# Patient Record
Sex: Male | Born: 1945 | Race: White | Hispanic: No | Marital: Married
Health system: Southern US, Community
[De-identification: ages and names within clinical notes are randomized; demographics above are authoritative.]

## PROBLEM LIST (undated history)

## (undated) DIAGNOSIS — F419 Anxiety disorder, unspecified: Secondary | ICD-10-CM

## (undated) DIAGNOSIS — M109 Gout, unspecified: Secondary | ICD-10-CM

## (undated) DIAGNOSIS — I10 Essential (primary) hypertension: Secondary | ICD-10-CM

## (undated) DIAGNOSIS — K259 Gastric ulcer, unspecified as acute or chronic, without hemorrhage or perforation: Secondary | ICD-10-CM

## (undated) DIAGNOSIS — D72820 Lymphocytosis (symptomatic): Secondary | ICD-10-CM

## (undated) HISTORY — PX: TOE AMPUTATION: SHX809

## (undated) HISTORY — PX: HIP FRACTURE SURGERY: SHX118

---

## 2015-01-18 ENCOUNTER — Ambulatory Visit: Payer: Medicare Other

## 2015-01-18 ENCOUNTER — Ambulatory Visit
Admission: EM | Admit: 2015-01-18 | Discharge: 2015-01-18 | Disposition: A | Discharge status: Home / Self Care | Payer: Medicare Other | Attending: Family Medicine | Admitting: Family Medicine

## 2015-01-18 DIAGNOSIS — F419 Anxiety disorder, unspecified: Secondary | ICD-10-CM | POA: Insufficient documentation

## 2015-01-18 DIAGNOSIS — R52 Pain, unspecified: Secondary | ICD-10-CM | POA: Diagnosis not present

## 2015-01-18 DIAGNOSIS — S8002XA Contusion of left knee, initial encounter: Secondary | ICD-10-CM | POA: Diagnosis not present

## 2015-01-18 DIAGNOSIS — W109XXA Fall (on) (from) unspecified stairs and steps, initial encounter: Secondary | ICD-10-CM | POA: Diagnosis not present

## 2015-01-18 DIAGNOSIS — M109 Gout, unspecified: Secondary | ICD-10-CM | POA: Insufficient documentation

## 2015-01-18 DIAGNOSIS — I10 Essential (primary) hypertension: Secondary | ICD-10-CM | POA: Insufficient documentation

## 2015-01-18 DIAGNOSIS — W19XXXA Unspecified fall, initial encounter: Secondary | ICD-10-CM

## 2015-01-18 DIAGNOSIS — S50812A Abrasion of left forearm, initial encounter: Secondary | ICD-10-CM | POA: Insufficient documentation

## 2015-01-18 DIAGNOSIS — S80212A Abrasion, left knee, initial encounter: Secondary | ICD-10-CM | POA: Insufficient documentation

## 2015-01-18 DIAGNOSIS — M79602 Pain in left arm: Secondary | ICD-10-CM | POA: Diagnosis present

## 2015-01-18 DIAGNOSIS — M25562 Pain in left knee: Secondary | ICD-10-CM | POA: Diagnosis present

## 2015-01-18 HISTORY — DX: Essential (primary) hypertension: I10

## 2015-01-18 HISTORY — DX: Lymphocytosis (symptomatic): D72.820

## 2015-01-18 HISTORY — DX: Gout, unspecified: M10.9

## 2015-01-18 HISTORY — DX: Gastric ulcer, unspecified as acute or chronic, without hemorrhage or perforation: K25.9

## 2015-01-18 HISTORY — DX: Anxiety disorder, unspecified: F41.9

## 2015-01-18 MED ORDER — DOXYCYCLINE HYCLATE 100 MG PO TABS: 100.0000 mg | ORAL_TABLET | Freq: Two times a day (BID) | ORAL | Status: AC

## 2015-01-18 MED ORDER — HYDROCODONE-ACETAMINOPHEN 5-325 MG PO TABS: 1.0000 | ORAL_TABLET | Freq: Four times a day (QID) | ORAL | Status: AC | PRN

## 2015-01-18 MED ORDER — TETANUS-DIPHTH-ACELL PERTUSSIS 5-2.5-18.5 LF-MCG/0.5 IM SUSP
0.5000 mL | Freq: Once | INTRAMUSCULAR | Status: AC
  Administered 2015-01-18: 0.5 mL via INTRAMUSCULAR

## 2015-01-18 NOTE — ED Provider Notes (Signed)
CSN: [id number]     Arrival date & time 01/18/15  1008 History   First MD Initiated Contact with Patient 01/18/15 1103     Chief Complaint  Patient presents with  . Fall    Pt missed bottom step of concrete steps last night and fell, landing on left knee, and then left arm. Avulsion to left elbow, abrasion and swelling to anterior left knee, and left low back pain although he denies hitting back (no discoloration to back noted). Also generalized body aching.   (Consider location/radiation/quality/duration/timing/severity/associated sxs/prior Treatment) HPI Comments: 69 yo male with a h/o right hip fracture about one year ago, on chronic prednisone, presents after a fall last night. States missed a step at a friends house and fell landing on his left side. Landed on his left knee. Complains of pain to the left low back/hip area. Scraped his left forearm. Denies hitting his head or loss of consciousness.   Patient is a 69 y.o. male presenting with fall. The history is provided by the patient.  Fall    Past Medical History  Diagnosis Date  . Gout   . Hypertension   . Gastric ulcer   . Anxiety   . Lymphocytosis     T-cell   Past Surgical History  Procedure Laterality Date  . Hip fracture surgery    . Toe amputation     No family history on file. History  Substance Use Topics  . Smoking status: Unknown If Ever Smoked  . Smokeless tobacco: Not on file  . Alcohol Use: Yes     Comment: rarely    Review of Systems  Allergies  Colchicine; Indomethacin; and Penicillins  Home Medications   Prior to Admission medications   Medication Sig Start Date End Date Taking? Authorizing Provider  escitalopram (LEXAPRO) 10 MG tablet Take 10 mg by mouth daily.   Yes Historical Provider, MD  folic acid (FOLVITE) 1 MG tablet Take 1 mg by mouth daily.   Yes Historical Provider, MD  lisinopril (PRINIVIL,ZESTRIL) 10 MG tablet Take 10 mg by mouth daily.   Yes Historical Provider, MD  methotrexate  (RHEUMATREX) 2.5 MG tablet Take 2.5 mg by mouth once a week. Caution:Chemotherapy. Protect from light.   Yes Historical Provider, MD  predniSONE (DELTASONE) 20 MG tablet Take 20 mg by mouth daily with breakfast.   Yes Historical Provider, MD  vitamin B-12 (CYANOCOBALAMIN) 1000 MCG tablet Take 2,000 mcg by mouth daily.   Yes Historical Provider, MD  doxycycline (VIBRA-TABS) 100 MG tablet Take 1 tablet (100 mg total) by mouth 2 (two) times daily. 01/18/15   Payton Mccallum, MD  HYDROcodone-acetaminophen (NORCO/VICODIN) 5-325 MG per tablet Take 1-2 tablets by mouth every 6 (six) hours as needed. 01/18/15   Payton Mccallum, MD   BP 154/99 mmHg  Pulse 98  Temp(Src) 98 F (36.7 C) (Oral)  Ht 5\' 11"  (1.803 m)  Wt 208 lb (94.348 kg)  BMI 29.02 kg/m2  SpO2 100% Physical Exam  Constitutional: He appears well-developed and well-nourished. No distress.  Musculoskeletal: He exhibits tenderness.  Left upper extremity neurovascularly intact; normal ROM; no bony tenderness over forearm or elbow; no shoulder or clavicle tenderness;  Anterior left knee edema and tenderness to palpation; limited flexion/extension due to pain; left lower extremity neurovascularly intact  Neurological: He is alert.  Skin: He is not diaphoretic.  Large skin abrasion on left forearm; and moderate skin abrasion on left knee.  Nursing note and vitals reviewed.   ED Course  Procedures (  including critical care time) Labs Review Labs Reviewed - No data to display  Imaging Review Dg Knee Complete 4 Views Left  01/18/2015   CLINICAL DATA:  Fall, acute pain and swelling of the left knee.  EXAM: LEFT KNEE - COMPLETE 4+ VIEW  COMPARISON:  None.  FINDINGS: Diffuse osteopenia of the left lower extremity. 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 Peripheral atherosclerosis noted. Femoral vascular stents partially imaged on the exam.  Normal alignment without acute osseous  finding or fracture. Anterior patellar region soft tissue swelling noted.  IMPRESSION: Anterior patella soft tissue swelling without acute osseous finding or fracture.  No joint effusion  Osteopenia and peripheral atherosclerosis   Electronically Signed   By: Judie Petit.  Shick M.D.   On: 01/18/2015 12:40   Dg Hip Unilat With Pelvis 2-3 Views Left  01/18/2015   CLINICAL DATA:  Pain.  EXAM: LEFT HIP (WITH PELVIS) 2-3 VIEWS  COMPARISON:  None.  FINDINGS: Patient has had previous ORIF of the right hip. Left femoral artery stent graft. No evidence for acute fracture or dislocation.  IMPRESSION: No evidence for acute  abnormality.   Electronically Signed   By: Norva Pavlov M.D.   On: 01/18/2015 12:41     MDM   1. Fall, initial encounter   2. Pain   3. Forearm abrasion, left, initial encounter   4. Knee contusion, left, initial encounter    Discharge Medication List as of 01/18/2015  1:25 PM    START taking these medications   Details  doxycycline (VIBRA-TABS) 100 MG tablet Take 1 tablet (100 mg total) by mouth 2 (two) times daily., Starting 01/18/2015, Until Discontinued, Normal    HYDROcodone-acetaminophen (NORCO/VICODIN) 5-325 MG per tablet Take 1-2 tablets by mouth every 6 (six) hours as needed., Starting 01/18/2015, Until Discontinued, Print         Plan: 1. x-ray results(negative for acute fracture) and diagnosis reviewed with patient 2. rx as per orders; risks, benefits, potential side effects reviewed with patient; prophylactic antibiotic due to underlying chronic conditions and medications; abrasion wounds dressed; wound care instructions given for abrasions 3. Recommend supportive treatment with rest ice, elevation of left lower extremity 4. F/u with PCP next week for recheck abrasions/wounds or here prn  Payton Mccallum, MD 01/18/15 1326

## 2015-01-29 ENCOUNTER — Ambulatory Visit
Admission: EM | Admit: 2015-01-29 | Discharge: 2015-01-29 | Disposition: A | Discharge status: Home / Self Care | Payer: Medicare Other | Attending: Family Medicine | Admitting: Family Medicine

## 2015-01-29 ENCOUNTER — Ambulatory Visit
Admission: RE | Admit: 2015-01-29 | Discharge: 2015-01-29 | Disposition: A | Discharge status: Home / Self Care | Payer: Medicare Other | Source: Ambulatory Visit | Attending: Emergency Medicine | Admitting: Emergency Medicine

## 2015-01-29 ENCOUNTER — Encounter: Payer: Self-pay | Admitting: Emergency Medicine

## 2015-01-29 ENCOUNTER — Ambulatory Visit
Admission: RE | Admit: 2015-01-29 | Discharge: 2015-01-29 | Disposition: A | Discharge status: Home / Self Care | Payer: Medicare Other | Source: Ambulatory Visit | Attending: *Deleted | Admitting: *Deleted

## 2015-01-29 DIAGNOSIS — S8012XD Contusion of left lower leg, subsequent encounter: Secondary | ICD-10-CM

## 2015-01-29 DIAGNOSIS — R609 Edema, unspecified: Secondary | ICD-10-CM

## 2015-01-29 DIAGNOSIS — S8002XD Contusion of left knee, subsequent encounter: Secondary | ICD-10-CM

## 2015-01-29 DIAGNOSIS — M7989 Other specified soft tissue disorders: Secondary | ICD-10-CM | POA: Insufficient documentation

## 2015-01-29 NOTE — ED Provider Notes (Addendum)
CSN: [id number]     Arrival date & time 01/29/15  1346 History   First MD Initiated Contact with Patient 01/29/15 1504     Chief Complaint  Patient presents with  . Knee Pain    left knee  . Leg Swelling   (Consider location/radiation/quality/duration/timing/severity/associated sxs/prior Treatment) HPI  this is a 69 year old male who fell 2 weeks ago was examined here by Dr. Izell Carolinaonti where x-rays showed no fracture of his left knee and a large abrasion of the lateral elbow. He returns today because of ongoing left knee pain where he indicates the fibular head. He notices it for a short period time when he first stands and begins to walk.  He does admit that it has been improving every single day. He has not been walking much since March of this year according to his wife. He also had minor back spasms this morning since resolved. Besides the fibular head pain it is noticed that he has a shiny swollen left lower extremity with pitting edema worse than the right.  Past Medical History  Diagnosis Date  . Gout   . Hypertension   . Gastric ulcer   . Anxiety   . Lymphocytosis     T-cell   Past Surgical History  Procedure Laterality Date  . Hip fracture surgery    . Toe amputation     History reviewed. No pertinent family history. History  Substance Use Topics  . Smoking status: Unknown If Ever Smoked  . Smokeless tobacco: Not on file  . Alcohol Use: Yes     Comment: rarely    Review of Systems  Constitutional: Positive for activity change.  Musculoskeletal: Positive for gait problem.  All other systems reviewed and are negative.   Allergies  Colchicine; Indomethacin; and Penicillins  Home Medications   Prior to Admission medications   Medication Sig Start Date End Date Taking? Authorizing Provider  ALPRAZolam (XANAX) 0.25 MG tablet Take 0.25 mg by mouth 3 (three) times daily as needed for anxiety.   Yes Historical Provider, MD  aspirin 81 MG tablet Take 81 mg by mouth daily.    Yes Historical Provider, MD  Febuxostat (ULORIC) 80 MG TABS Take 1 tablet by mouth daily.   Yes Historical Provider, MD  furosemide (LASIX) 40 MG tablet Take 40 mg by mouth daily.   Yes Historical Provider, MD  traMADol (ULTRAM) 50 MG tablet Take 50 mg by mouth every 6 (six) hours as needed.   Yes Historical Provider, MD  doxycycline (VIBRA-TABS) 100 MG tablet Take 1 tablet (100 mg total) by mouth 2 (two) times daily. 01/18/15   Payton Mccallumrlando Conty, MD  escitalopram (LEXAPRO) 10 MG tablet Take 10 mg by mouth daily.    Historical Provider, MD  folic acid (FOLVITE) 1 MG tablet Take 1 mg by mouth daily.    Historical Provider, MD  HYDROcodone-acetaminophen (NORCO/VICODIN) 5-325 MG per tablet Take 1-2 tablets by mouth every 6 (six) hours as needed. 01/18/15   Payton Mccallumrlando Conty, MD  lisinopril (PRINIVIL,ZESTRIL) 10 MG tablet Take 10 mg by mouth daily.    Historical Provider, MD  methotrexate (RHEUMATREX) 2.5 MG tablet Take 2.5 mg by mouth once a week. Caution:Chemotherapy. Protect from light.    Historical Provider, MD  predniSONE (DELTASONE) 20 MG tablet Take 20 mg by mouth daily with breakfast.    Historical Provider, MD  vitamin B-12 (CYANOCOBALAMIN) 1000 MCG tablet Take 2,000 mcg by mouth daily.    Historical Provider, MD   BP 113/54 mmHg  Pulse 92  Temp(Src) 98.2 F (36.8 C) (Tympanic)  Resp 16  Ht 6' (1.829 m)  Wt 208 lb (94.348 kg)  BMI 28.20 kg/m2  SpO2 99% Physical Exam  Constitutional: He appears well-developed and well-nourished.  HENT:  Head: Normocephalic and atraumatic.  Eyes: Pupils are equal, round, and reactive to light.  Musculoskeletal:  Admission of the left lateral elbow shows a large abrasion which is healing. Distally there appears to be a small amount of blood at the edge of the wound. He has numerous chemotic spots in both arms. Examination of the left knee shows tenderness over the fibular head no induration and no crepitus is appreciated. However the leg has a very shiny  appearance from swelling with 1-2+ pitting edema and a larger diameter when compared to the right. Maximal circumference on the left was 375 mm maximum circumference on the right was 350 mm There is no significant tenderness posteriorly and no warmth. However because of his inactivity recently and with the swelling and pitting edema concerned for possible DVT and will rule it out  Nursing note and vitals reviewed.   ED Course  Procedures (including critical care time) Labs Review Labs Reviewed - No data to display  Imaging Review US Venous Img Lower Unilateral Left  01/29/2015   CLINICAL DATA:  Subacute onset of left lower extremity pain and swelling for 2 weeks. Initial encounter.  EXAM: LEFT LOWER EXTREMITY VENOUS DOPPLER ULTRASOUND  TECHNIQUE: Gray-scale sonography with graded compression, as well as color Doppler and duplex ultrasound were performed to evaluate the lower extremity deep venous systems from the level of the common femoral vein and including the common femoral, femoral, profunda femoral, popliteal and calf veins including the posterior tibial, peroneal and gastrocnemius veins when visible. The superficial great saphenous vein was also interrogated. Spectral Doppler was utilized to evaluate flow at rest and with distal augmentation maneuvers in the common femoral, femoral and popliteal veins.  COMPARISON:  None.  FINDINGS: Contralateral Common Femoral Vein: Respiratory phasicity is normal and symmetric with the symptomatic side. No evidence of thrombus. Normal compressibility.  Common Femoral Vein: No evidence of thrombus. Normal compressibility, respiratory phasicity and response to augmentation.  Saphenofemoral Junction: No evidence of thrombus. Normal compressibility and flow on color Doppler imaging.  Profunda Femoral Vein: No evidence of thrombus. Normal compressibility and flow on color Doppler imaging.  Femoral Vein: No evidence of thrombus. Normal compressibility, respiratory  phasicity and response to augmentation.  Popliteal Vein: No evidence of thrombus. Normal compressibility, respiratory phasicity and response to augmentation.  Calf Veins: No evidence of thrombus. Normal compressibility and flow on color Doppler imaging. The peroneal vein is not visualized on this study.  Superficial Great Saphenous Vein: No evidence of thrombus. Normal compressibility and flow on color Doppler imaging.  Venous Reflux:  None.  Other Findings:  None.  IMPRESSION: No evidence of deep venous thrombosis.   Electronically Signed   By: Roanna Raider M.D.   On: 01/29/2015 17:31     MDM   1. Contusion of knee and lower leg, left, subsequent encounter   2. Pitting edema    Discharge Medication List as of 01/29/2015  3:41 PM     Plan: 1. Diagnosis reviewed with patient 2. rx as per orders; risks, benefits, potential side effects reviewed with patient 3. Recommend supportive treatment with rest,elevation 4. F/u prn if symptoms worsen or don't improve I talked with the patient and his wife. His fibular head pain is actually improving day  by day and should expect to continue to improve over time. His back spasm is chronic and there is nothing that needs to be done at this time. However I am concerned with the swelling and pitting edema of his left lower extremity especially with his  inactivity. He is agreeable to going to [facility] for a venous Doppler ultrasound which will be performed at this afternoon.  Lutricia Feil, PA-C 01/29/15 1544 received a call from radiology stating his venous Doppler ultrasound of the left lower extremity was negative with no evidence of a deep venous thrombosis. I spoke to the patient and told him it's imperative that he elevate his legs sufficiently to control the swelling and to keep his appointment with his primary care next Wednesday.  Lutricia Feil, PA-C 01/29/15 [place]

## 2015-01-29 NOTE — ED Notes (Signed)
Patient here for follow-up visit from fall 2 weeks ago.  Patient c/o ongoing pain in his left knee and swelling in his left lower leg.  Patient reports ongoing spasm on his left lower back.

## 2016-05-02 IMAGING — CR DG HIP (WITH OR WITHOUT PELVIS) 2-3V*L*
3 series · 3 of 3 positions shown · non-contrast
Comparison: None.

CLINICAL DATA: Pain.

EXAM:
LEFT HIP (WITH PELVIS) 2-3 VIEWS

[pelvis ap]
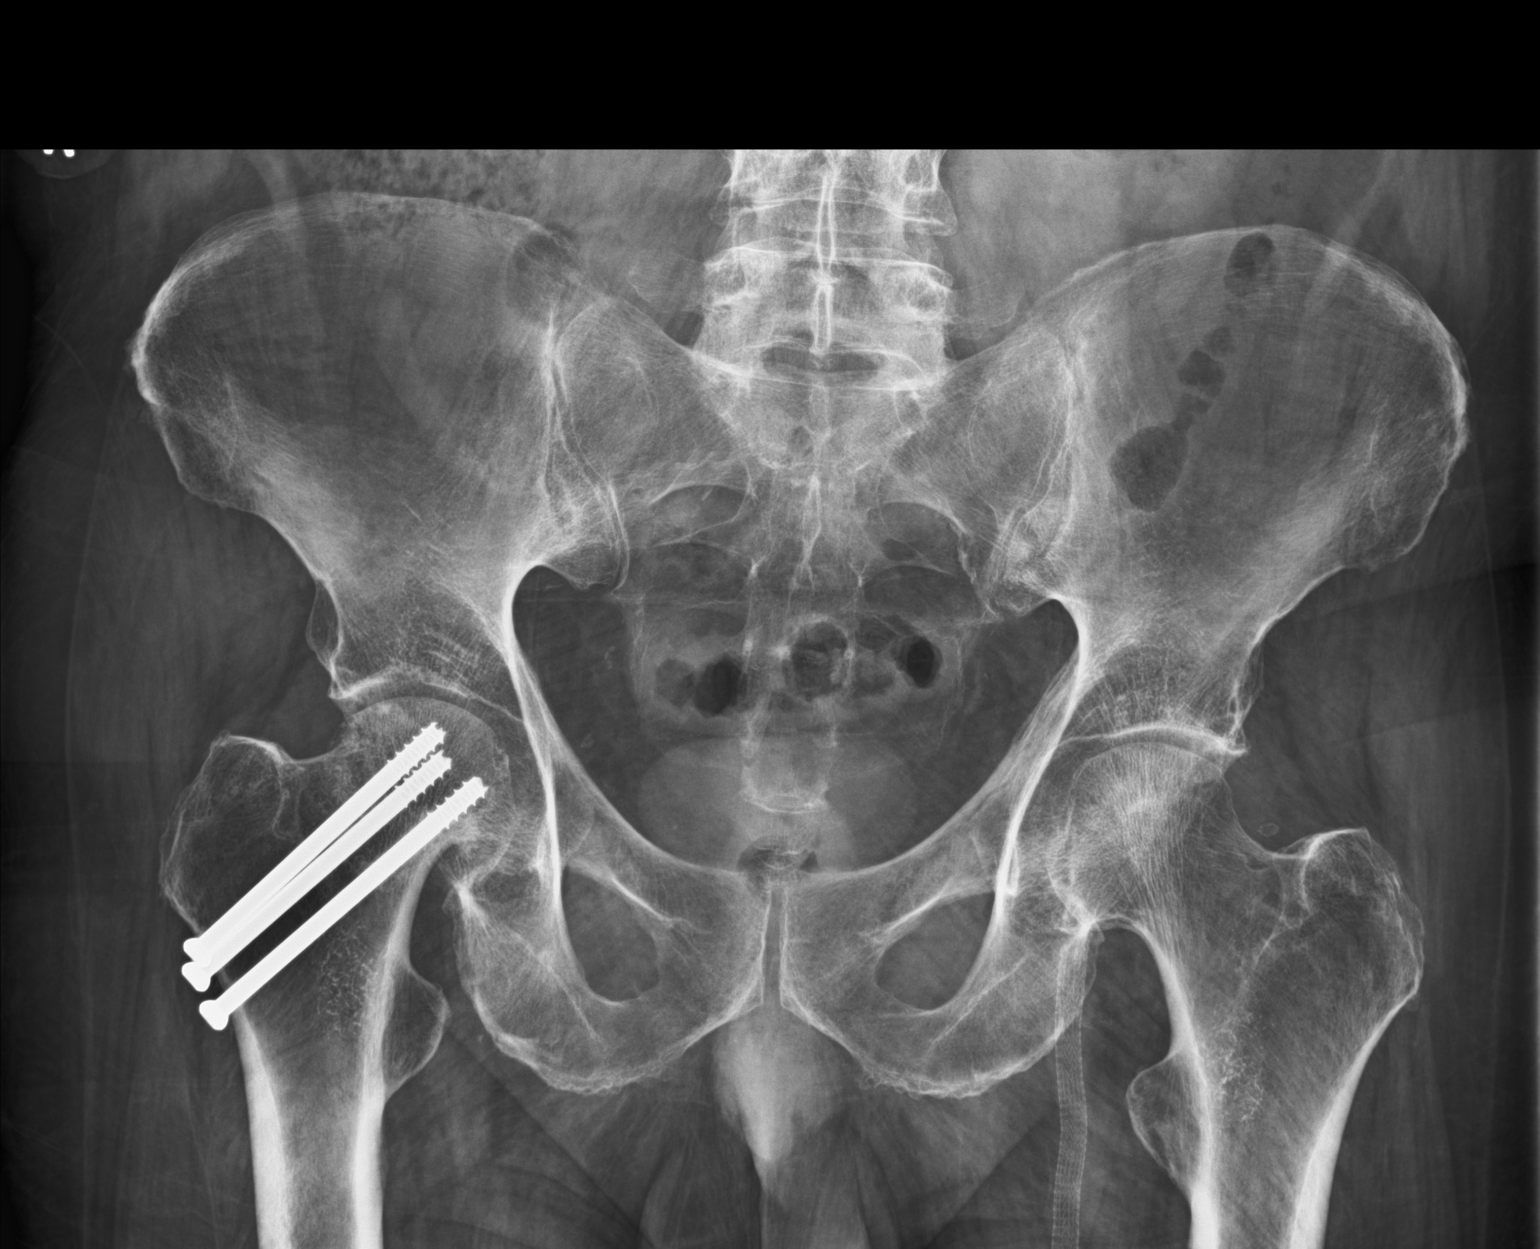

[hip ap]
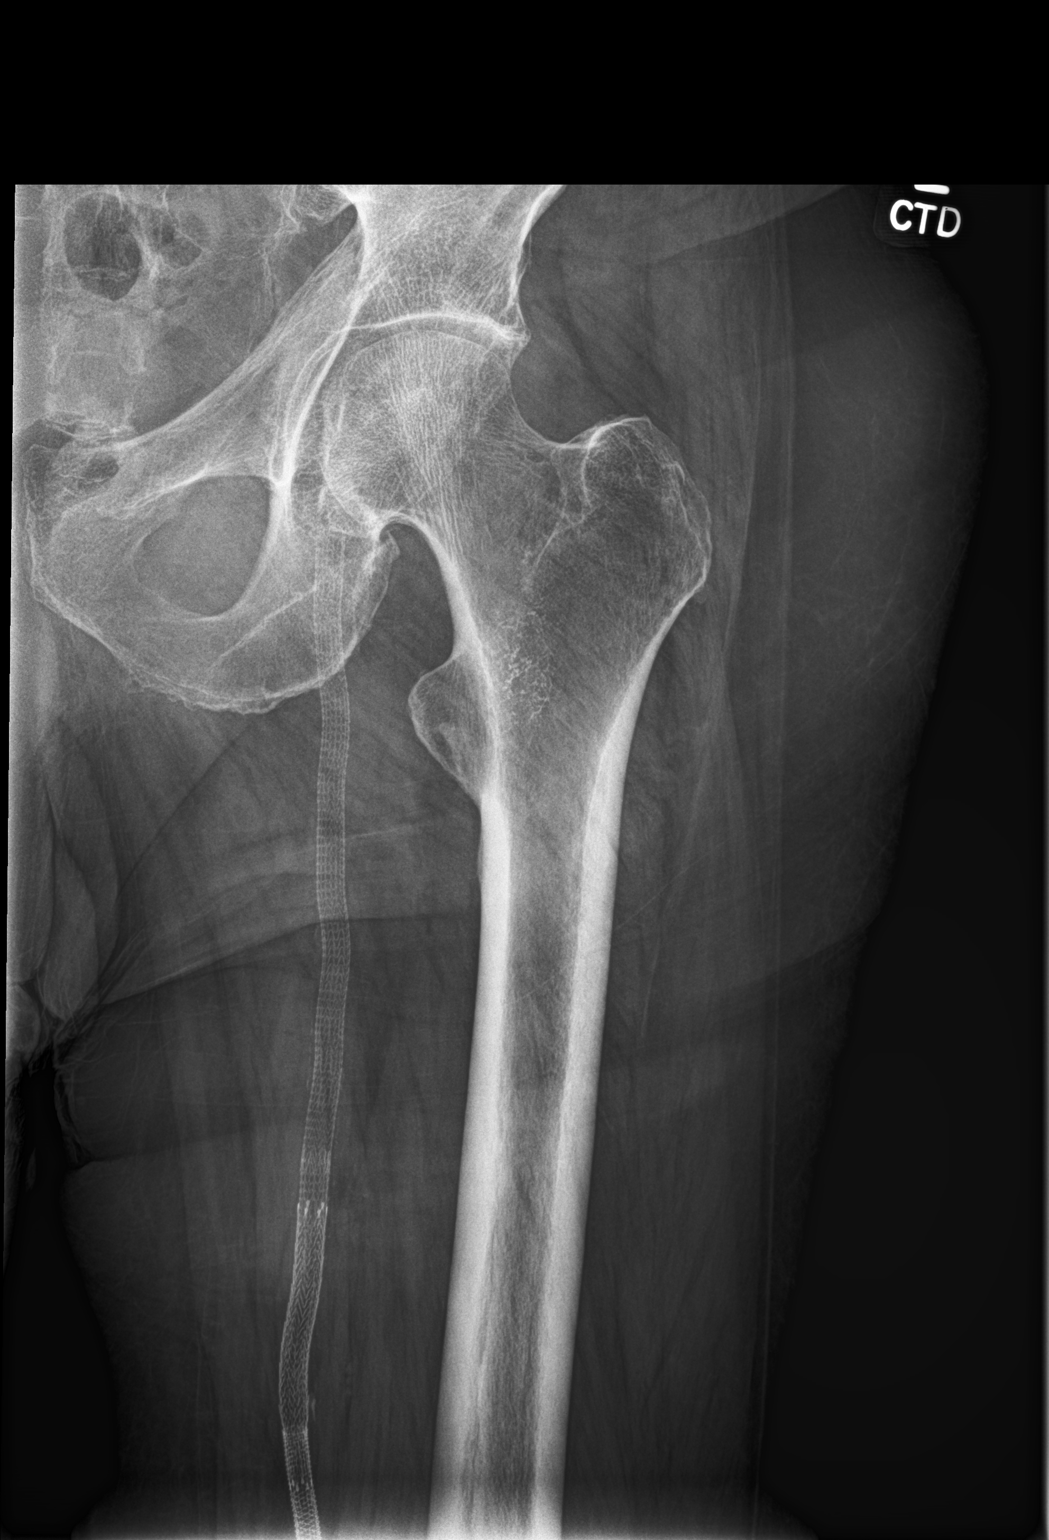

[hip lat]
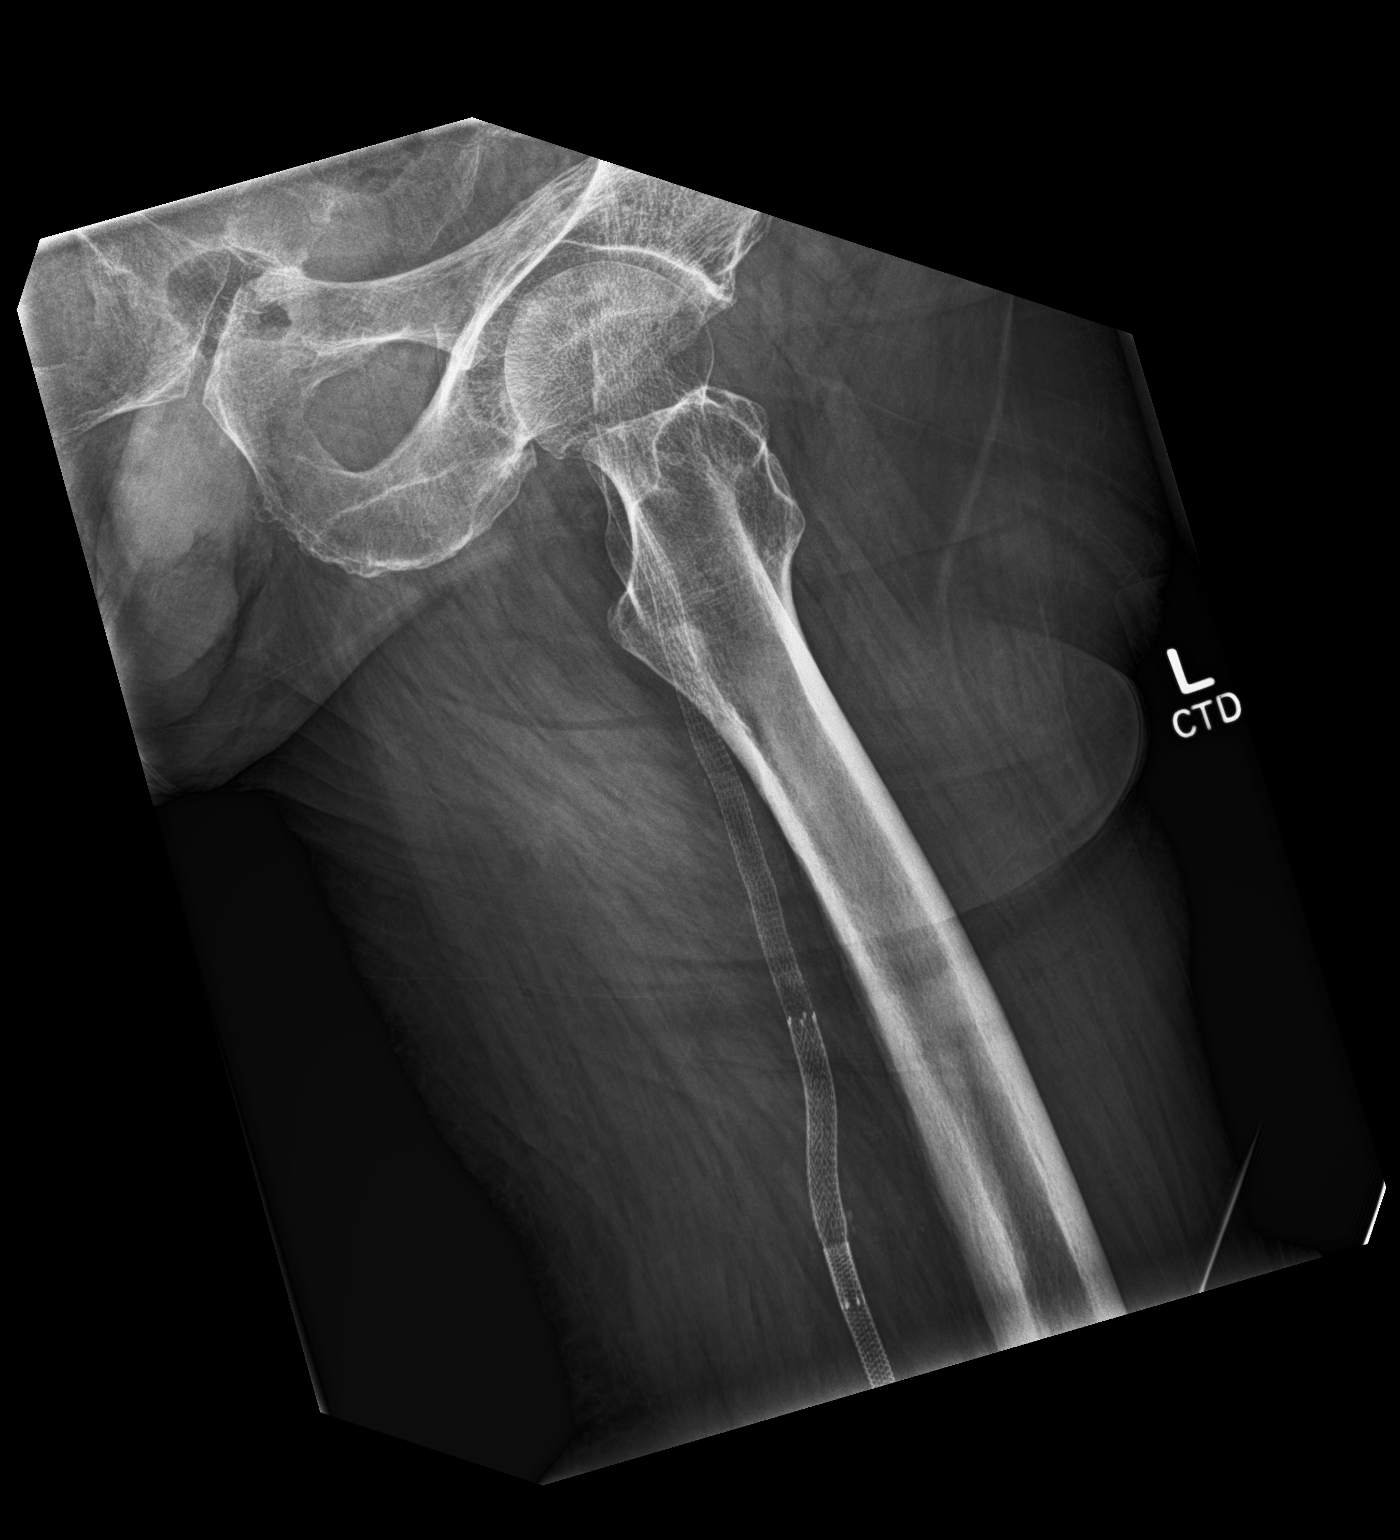

[3 of 3 positions shown; findings below may reference images not displayed]

FINDINGS: Patient has had previous ORIF of the right hip. Left femoral artery
stent graft. No evidence for acute fracture or dislocation.
IMPRESSION: No evidence for acute  abnormality.

## 2016-05-02 IMAGING — CR DG KNEE COMPLETE 4+V*L*
5 series · 5 of 5 positions shown · non-contrast
Comparison: None.

CLINICAL DATA: Fall, acute pain and swelling of the left knee.

EXAM:
LEFT KNEE - COMPLETE 4+ VIEW

[knee ap (1 of 3)]
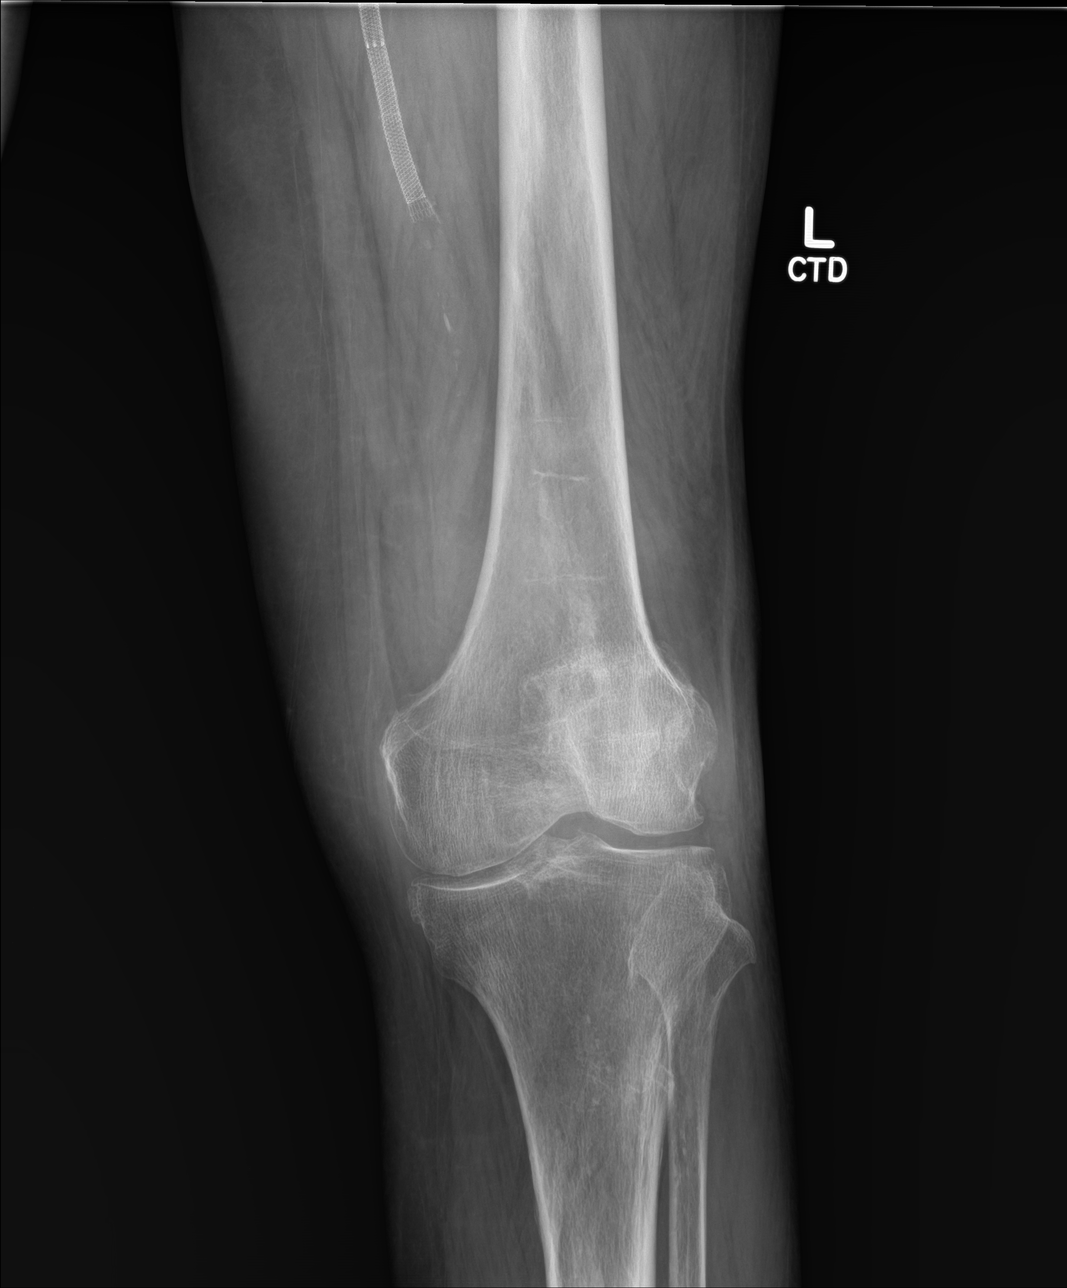

[knee lat]
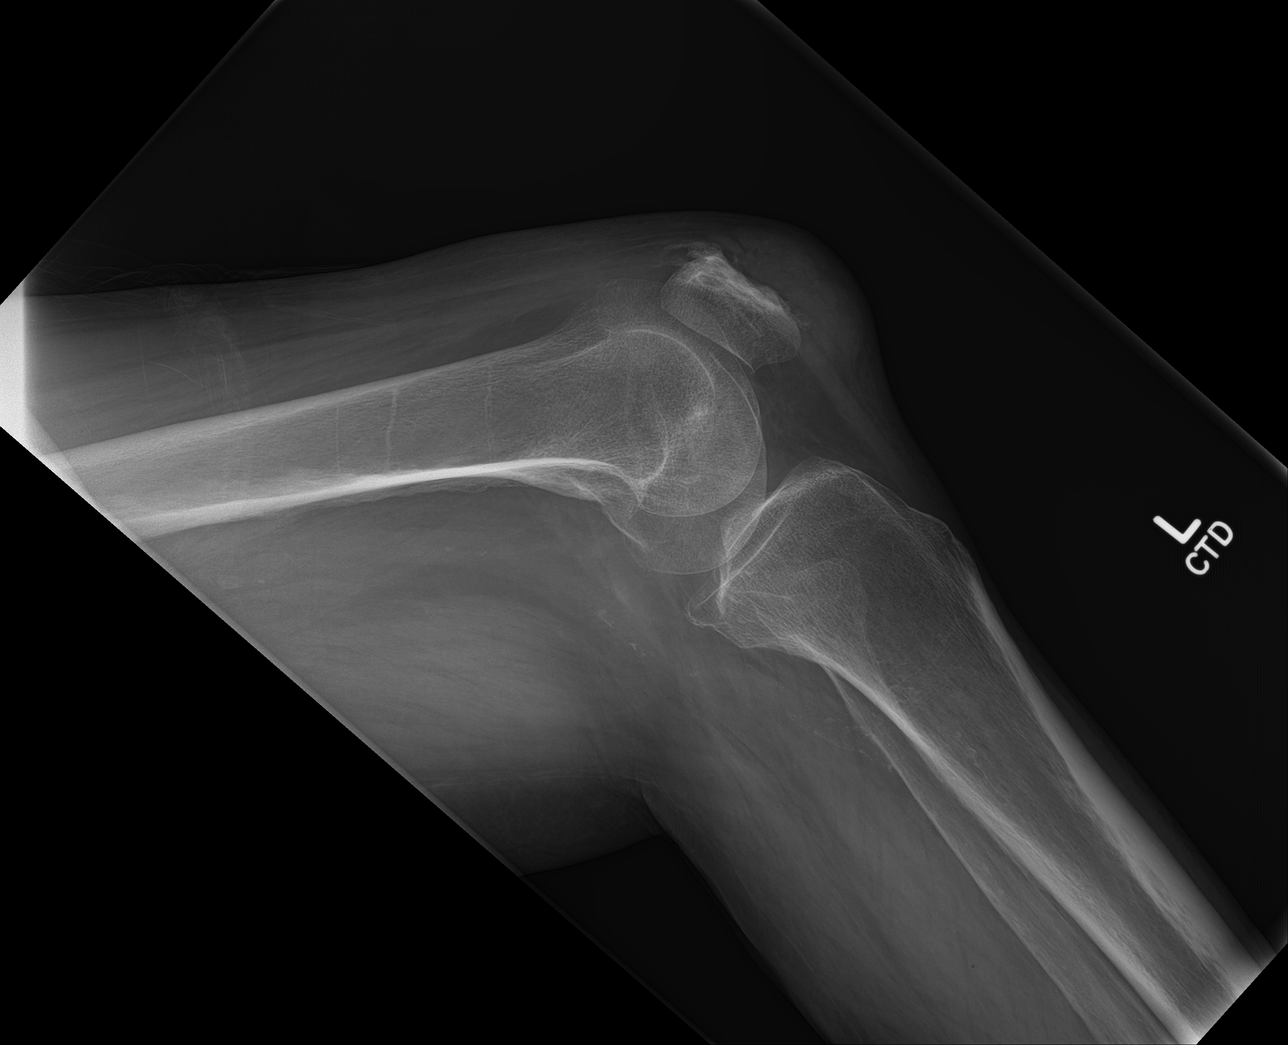

[patella skyline]
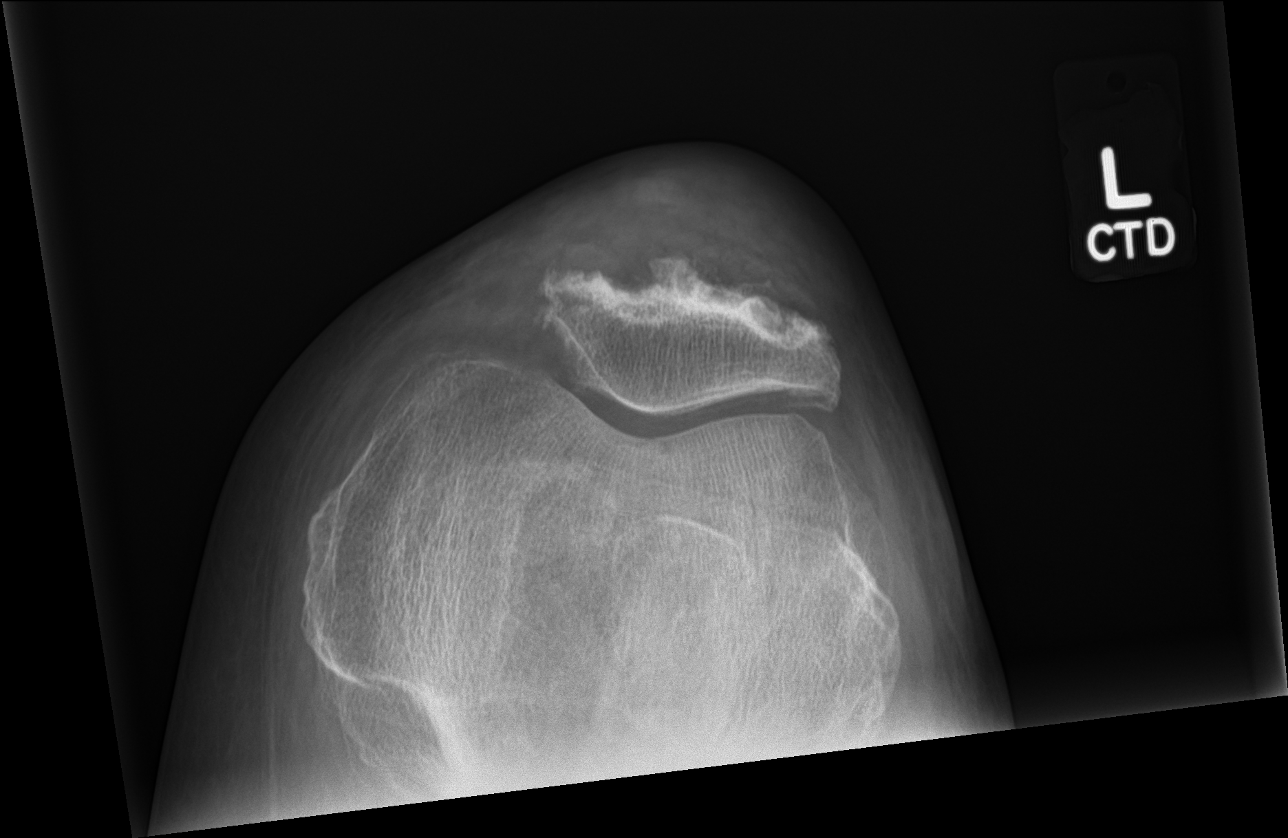

[knee ap (2 of 3)]
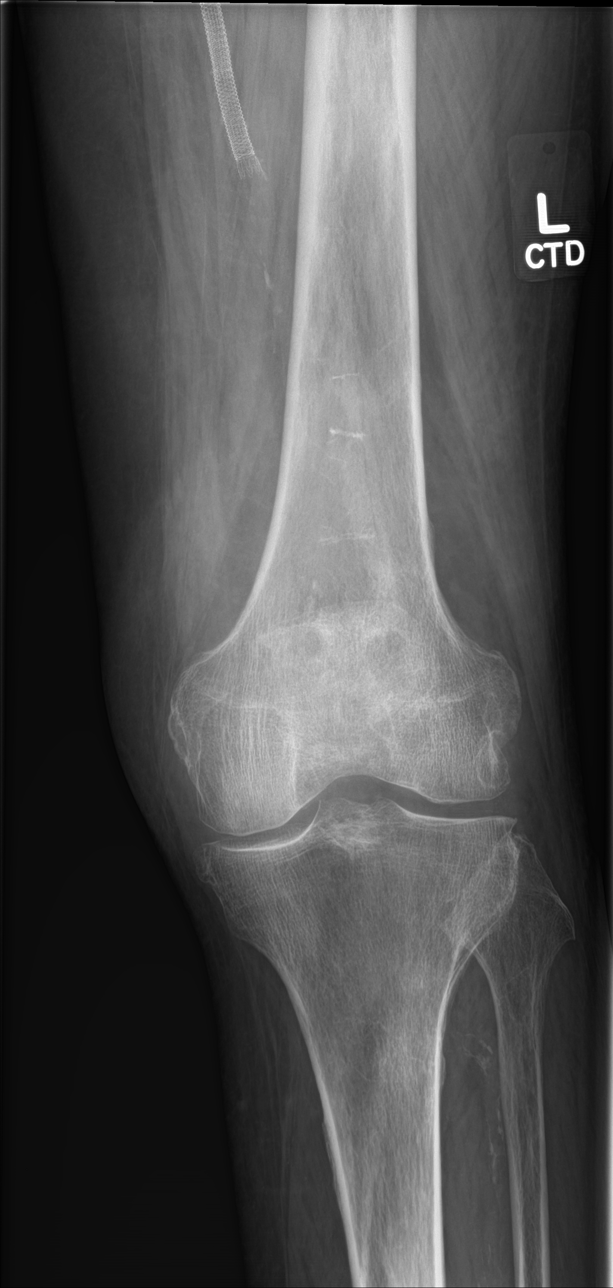

[knee ap (3 of 3)]
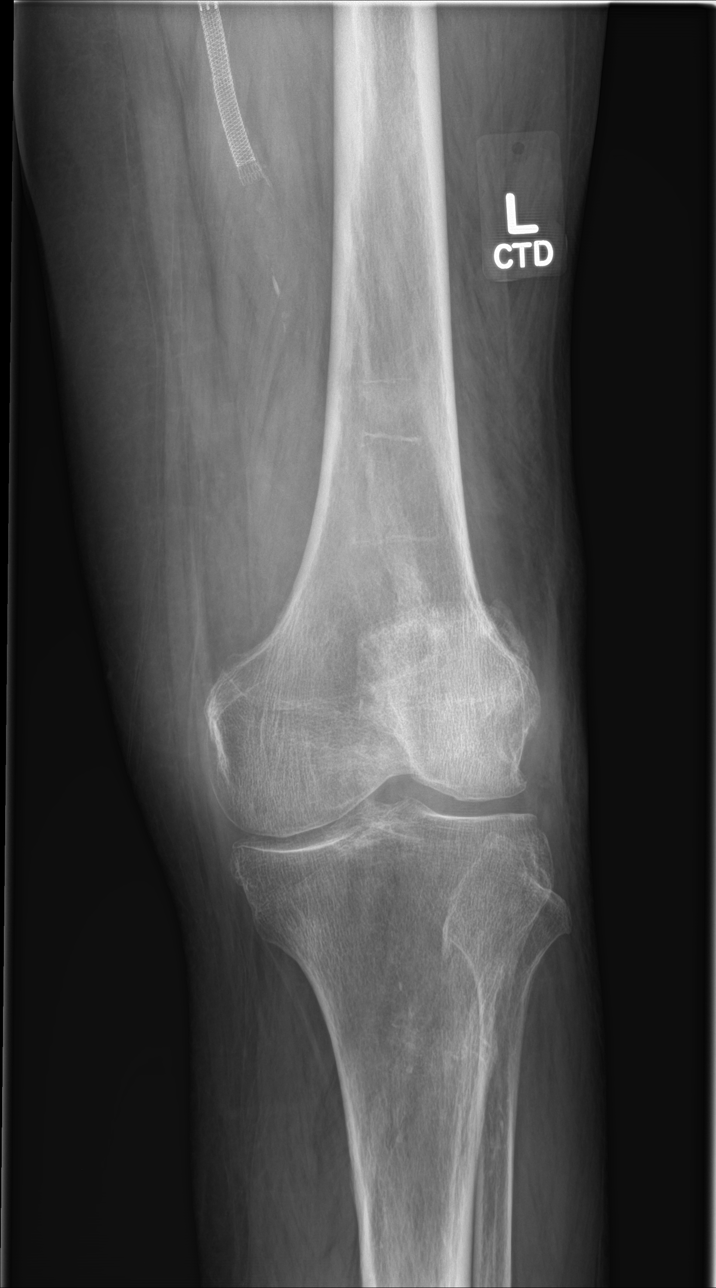

[5 of 5 positions shown; findings below may reference images not displayed]

FINDINGS: Diffuse osteopenia of the left lower extremity. Mild
tricompartmental osteoarthritis of the left knee with joint space
loss, sclerosis and bony spurring. Peripheral atherosclerosis noted.
Femoral vascular stents partially imaged on the exam.

Normal alignment without acute osseous finding or fracture. Anterior
patellar region soft tissue swelling noted.
IMPRESSION: Anterior patella soft tissue swelling without acute osseous finding
or fracture.

No joint effusion

Osteopenia and peripheral atherosclerosis

## 2017-05-28 IMAGING — US US EXTREM LOW VENOUS*L*
1 series · 13 of 24 positions shown · non-contrast
Comparison: None.

CLINICAL DATA: Subacute onset of left lower extremity pain and
swelling for 2 weeks. Initial encounter.



[Series 1: us extrem low venous*left* · 0.11mm/px · 13 of 26 slices shown]
[im 1/26]
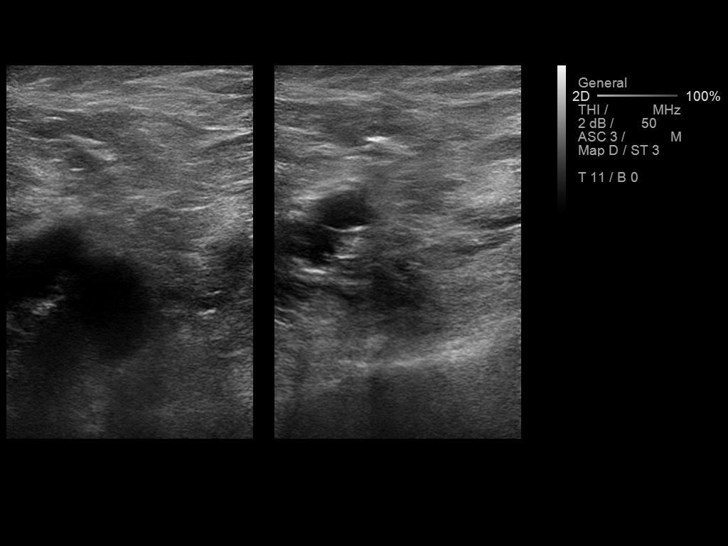
[im 3/26]
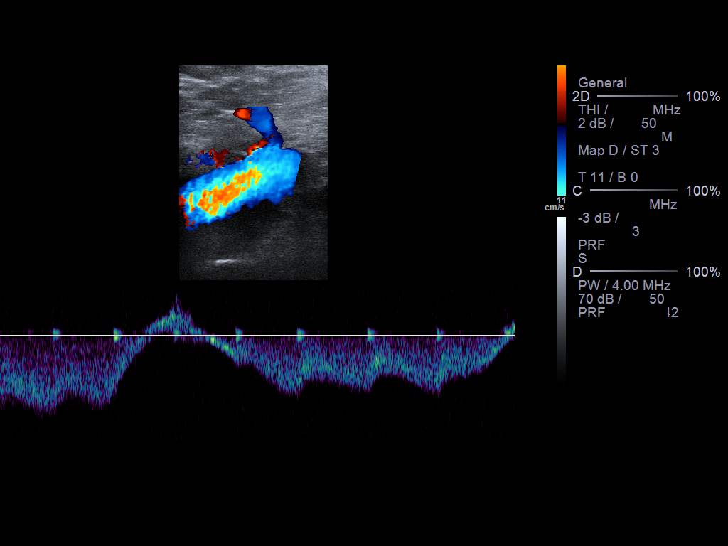
[im 5/26]
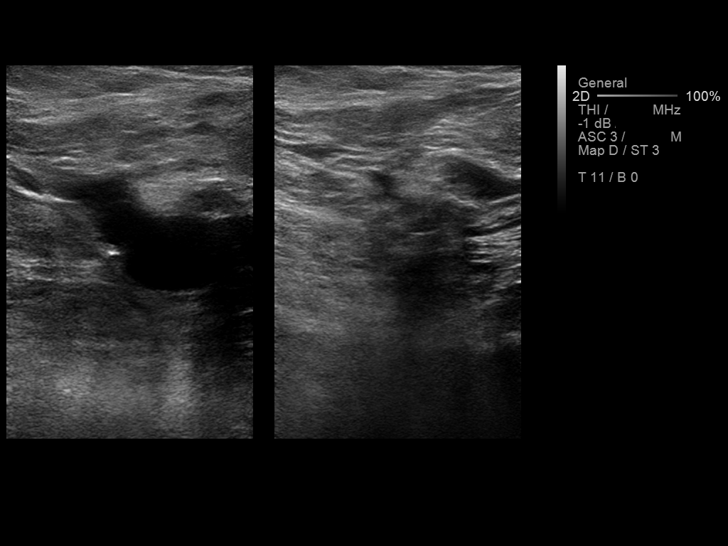
[im 7/26]
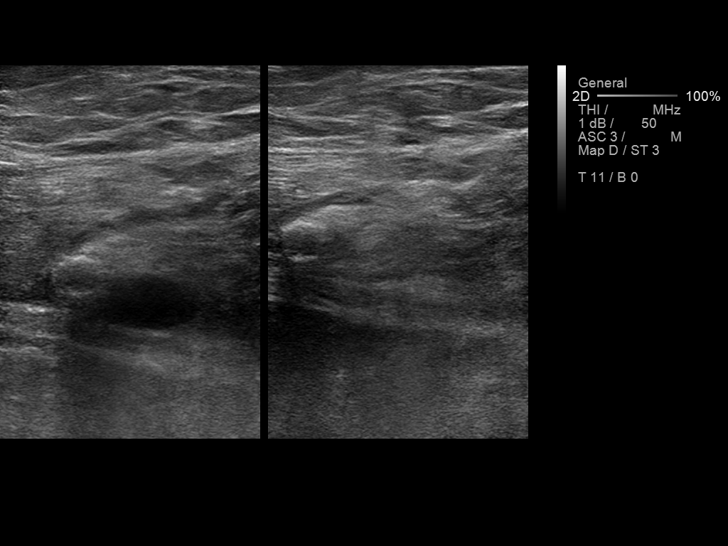
[im 9/26]
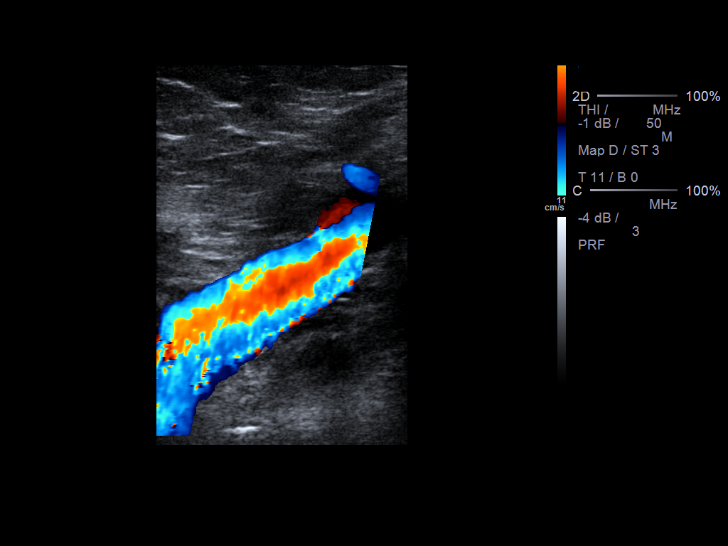
[im 11/26]
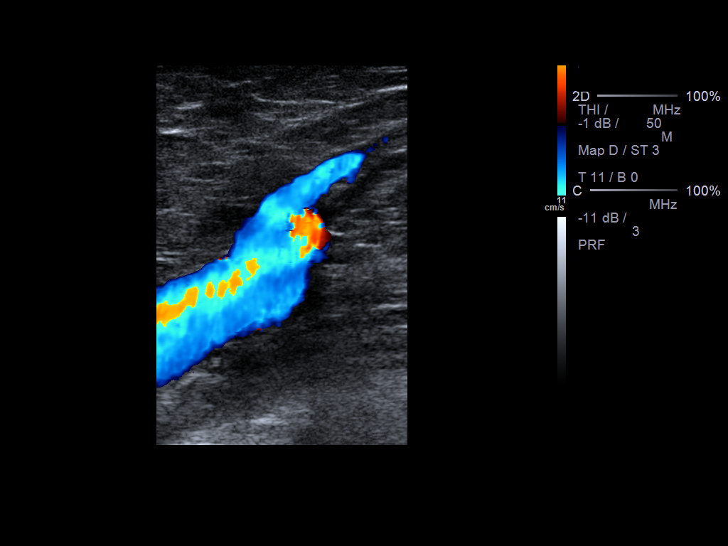
[im 14/26]
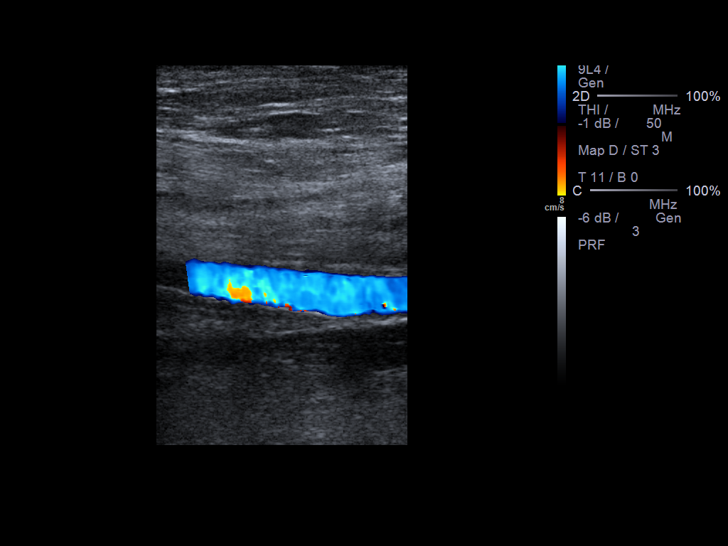
[im 15/26]
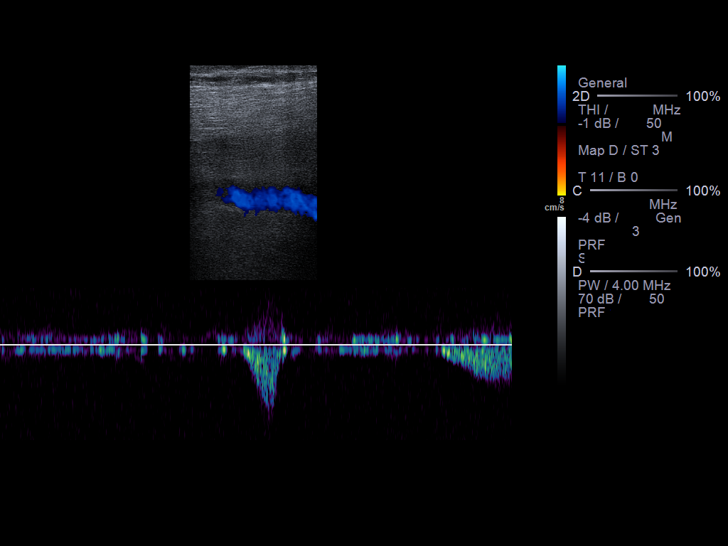
[im 17/26]
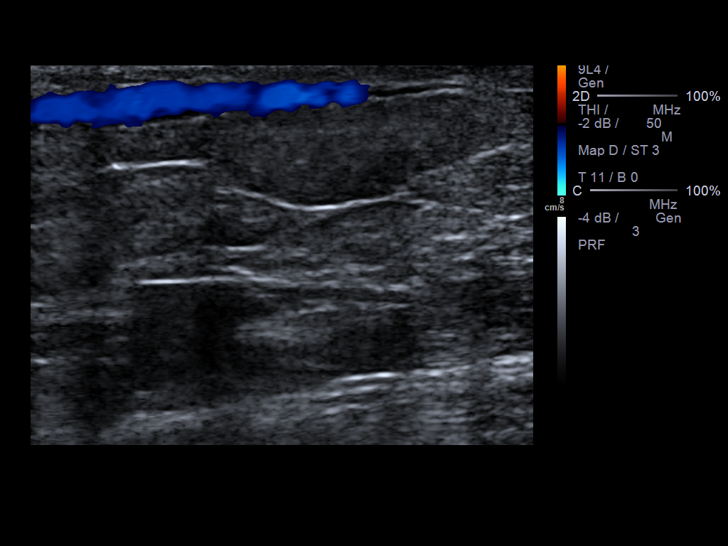
[im 19/26]
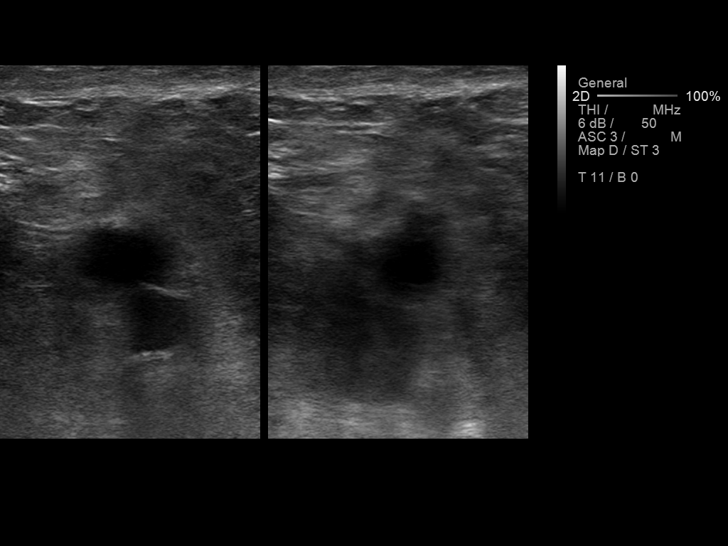
[im 21/26]
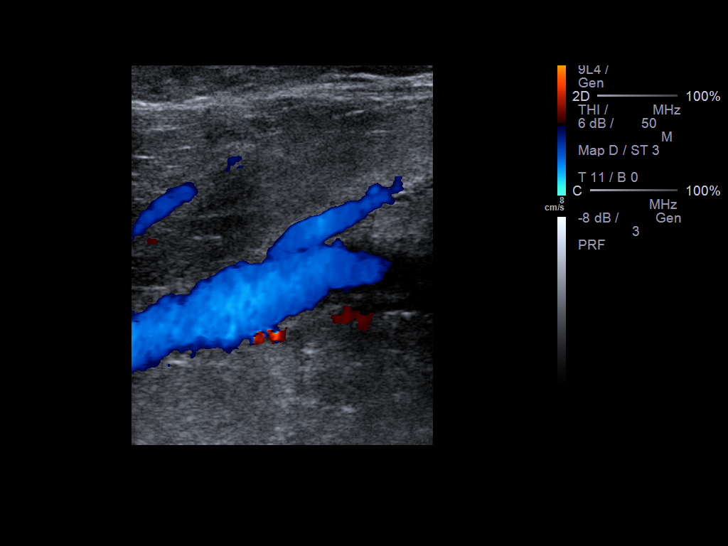
[im 23/26]
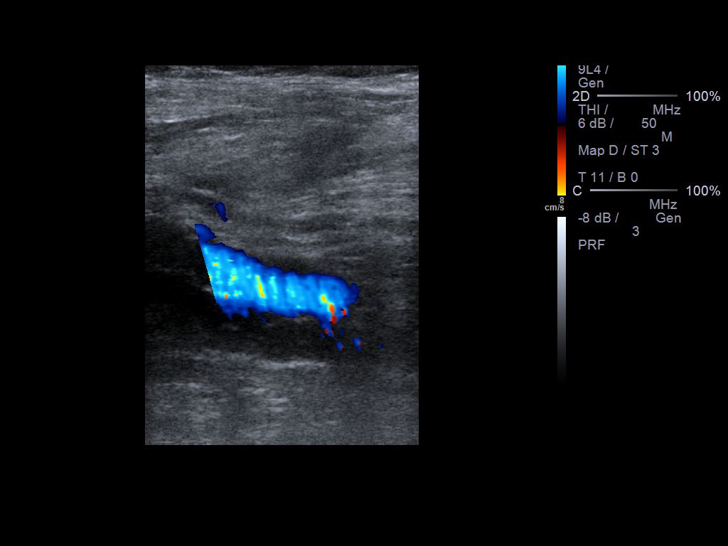
[im 26/26]
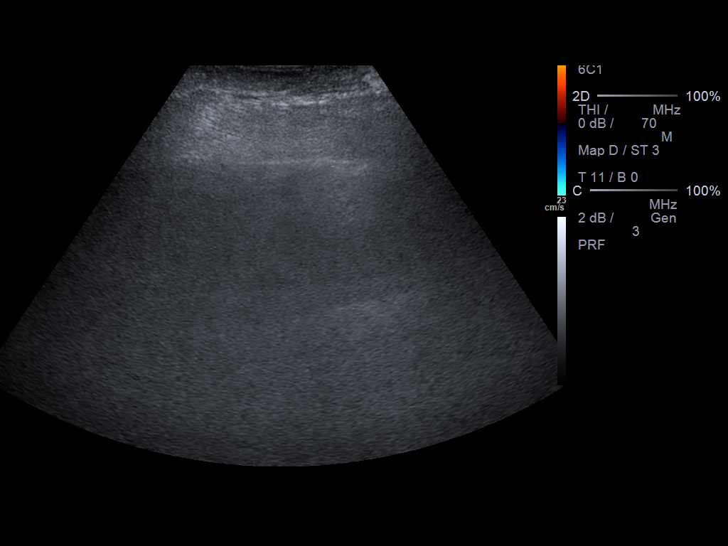

[13 of 24 positions shown; findings below may reference images not displayed]

FINDINGS: Contralateral Common Femoral Vein: Respiratory phasicity is normal
and symmetric with the symptomatic side. No evidence of thrombus.
Normal compressibility.

Common Femoral Vein: No evidence of thrombus. Normal
compressibility, respiratory phasicity and response to augmentation.

Saphenofemoral Junction: No evidence of thrombus. Normal
compressibility and flow on color Doppler imaging.

Profunda Femoral Vein: No evidence of thrombus. Normal
compressibility and flow on color Doppler imaging.

Femoral Vein: No evidence of thrombus. Normal compressibility,
respiratory phasicity and response to augmentation.

Popliteal Vein: No evidence of thrombus. Normal compressibility,
respiratory phasicity and response to augmentation.

Calf Veins: No evidence of thrombus. Normal compressibility and flow
on color Doppler imaging. The peroneal vein is not visualized on
this study.

Superficial Great Saphenous Vein: No evidence of thrombus. Normal
compressibility and flow on color Doppler imaging.

Venous Reflux:  None.

Other Findings:  None.
IMPRESSION: No evidence of deep venous thrombosis.
